# Patient Record
Sex: Female | Born: 1980 | Race: White | Hispanic: No | Marital: Single | Smoking: Never smoker
Health system: Southern US, Community
[De-identification: ages and names within clinical notes are randomized; demographics above are authoritative.]

## PROBLEM LIST (undated history)

## (undated) DIAGNOSIS — R87619 Unspecified abnormal cytological findings in specimens from cervix uteri: Secondary | ICD-10-CM

## (undated) DIAGNOSIS — J45909 Unspecified asthma, uncomplicated: Secondary | ICD-10-CM

---

## 2004-05-29 DIAGNOSIS — R87619 Unspecified abnormal cytological findings in specimens from cervix uteri: Secondary | ICD-10-CM

## 2004-05-29 HISTORY — DX: Unspecified abnormal cytological findings in specimens from cervix uteri: R87.619

## 2009-05-29 HISTORY — PX: WISDOM TOOTH EXTRACTION: SHX21

## 2011-11-17 ENCOUNTER — Other Ambulatory Visit (HOSPITAL_COMMUNITY): Payer: Self-pay | Admitting: Obstetrics and Gynecology

## 2011-11-17 DIAGNOSIS — O0930 Supervision of pregnancy with insufficient antenatal care, unspecified trimester: Secondary | ICD-10-CM

## 2011-11-17 DIAGNOSIS — O3680X0 Pregnancy with inconclusive fetal viability, not applicable or unspecified: Secondary | ICD-10-CM

## 2011-11-22 ENCOUNTER — Other Ambulatory Visit (HOSPITAL_COMMUNITY): Payer: Self-pay | Admitting: Obstetrics and Gynecology

## 2011-11-22 ENCOUNTER — Ambulatory Visit (HOSPITAL_COMMUNITY)
Admission: RE | Admit: 2011-11-22 | Discharge: 2011-11-22 | Disposition: A | Discharge status: Home / Self Care | Payer: Medicaid Other | Source: Ambulatory Visit | Attending: Obstetrics and Gynecology | Admitting: Obstetrics and Gynecology

## 2011-11-22 DIAGNOSIS — O3680X0 Pregnancy with inconclusive fetal viability, not applicable or unspecified: Secondary | ICD-10-CM

## 2011-11-22 DIAGNOSIS — O0930 Supervision of pregnancy with insufficient antenatal care, unspecified trimester: Secondary | ICD-10-CM

## 2011-11-22 DIAGNOSIS — Z3689 Encounter for other specified antenatal screening: Secondary | ICD-10-CM | POA: Insufficient documentation

## 2011-11-29 ENCOUNTER — Encounter (HOSPITAL_COMMUNITY): Payer: Self-pay | Admitting: *Deleted

## 2011-12-01 ENCOUNTER — Other Ambulatory Visit: Payer: Self-pay | Admitting: Obstetrics and Gynecology

## 2011-12-02 ENCOUNTER — Encounter (HOSPITAL_COMMUNITY): Payer: Self-pay

## 2011-12-07 ENCOUNTER — Other Ambulatory Visit: Payer: Self-pay | Admitting: Obstetrics and Gynecology

## 2011-12-07 NOTE — H&P (Signed)
NAME:  Katie Bradley, DIEGO NO.:  MEDICAL RECORD NO.:  [id number]  LOCATION:                                 FACILITY:  PHYSICIAN:  Malachi Pro. Ambrose Mantle, M.D. DATE OF BIRTH:  23-May-1981  DATE OF ADMISSION:  12/08/2011 DATE OF DISCHARGE:                             HISTORY & PHYSICAL   PRESENT ILLNESS:  This is a 31 year old black female, para 1-0-1-1, gravida 3 with last menstrual period July 29, 2011 who is admitted for D and C because of anembryonic pregnancy.  This patient's last period was July 29, 2011.  She began to think she was pregnant in late April 2013. She did not seek care because she was waiting for her Medicaid to become effective.  On November 22, 2011, she was seen in the maternity admission unit because of bleeding during pregnancy and the findings were anembryonic pregnancy.  The patient came to our office on July 3, was given her options of medical treatment, observation or D and C.  She chose to proceed with D and C.  She is admitted now for that procedure.  PAST MEDICAL HISTORY:  Reveals no known allergies.  No operations except wisdom teeth extraction.  She does have a history of asthma.  SOCIAL HISTORY:  She denies alcohol, tobacco, and substance abuse.  FAMILY HISTORY:  Her mother has diabetes and high blood pressure.  PHYSICAL EXAMINATION:  VITAL SIGNS:  Blood pressure is 118/70, pulse is 80. HEAD, EYES, NOSE, AND THROAT:  Normal. BREASTS:  Soft without masses. LUNGS:  Clear to auscultation. HEART:  Normal size and sounds.  No murmurs. ABDOMEN:  Soft, nontender.  No masses are palpable. PELVIC:  The uterus is anterior, thought to be about 7-8 week size, is slightly irregular suggesting fibroids.  The adnexa are free.  An ultrasound done in the office showed empty sac.  The patient has been given her options again.  She understands and wants to proceed with a suction D and C.  She has been informed of all the risks of surgery including,  but not limited to, blood loss, perforation of the uterus, injury to surrounding structures, infection, potential need for even hysterectomy.  The patient understands and agrees to proceed.     Malachi Pro. Ambrose Mantle, M.D.    TFH/MEDQ  D:  12/07/2011  T:  12/07/2011  Job:  [id number]

## 2011-12-08 ENCOUNTER — Ambulatory Visit (HOSPITAL_COMMUNITY)
Admission: RE | Admit: 2011-12-08 | Discharge: 2011-12-08 | Disposition: A | Discharge status: Home / Self Care | Payer: Medicaid Other | Source: Ambulatory Visit | Attending: Obstetrics and Gynecology | Admitting: Obstetrics and Gynecology

## 2011-12-08 ENCOUNTER — Encounter (HOSPITAL_COMMUNITY): Payer: Self-pay | Admitting: Anesthesiology

## 2011-12-08 ENCOUNTER — Ambulatory Visit (HOSPITAL_COMMUNITY): Payer: Medicaid Other | Admitting: Anesthesiology

## 2011-12-08 ENCOUNTER — Encounter (HOSPITAL_COMMUNITY)
Admission: RE | Disposition: A | Discharge status: Home / Self Care | Payer: Self-pay | Source: Ambulatory Visit | Attending: Obstetrics and Gynecology

## 2011-12-08 DIAGNOSIS — O020 Blighted ovum and nonhydatidiform mole: Secondary | ICD-10-CM

## 2011-12-08 DIAGNOSIS — N90810 Female genital mutilation status, unspecified: Secondary | ICD-10-CM

## 2011-12-08 DIAGNOSIS — O021 Missed abortion: Secondary | ICD-10-CM | POA: Insufficient documentation

## 2011-12-08 HISTORY — DX: Unspecified asthma, uncomplicated: J45.909

## 2011-12-08 HISTORY — PX: DILATION AND EVACUATION: SHX1459

## 2011-12-08 LAB — CBC WITH DIFFERENTIAL/PLATELET
Basophils Absolute: 0 10*3/uL (ref 0.0–0.1)
Basophils Relative: 0 % (ref 0–1)
HCT: 35.4 % — ABNORMAL LOW (ref 36.0–46.0)
MCHC: 33.6 g/dL (ref 30.0–36.0)
Monocytes Absolute: 0.5 10*3/uL (ref 0.1–1.0)
Neutro Abs: 3 10*3/uL (ref 1.7–7.7)
RDW: 13.3 % (ref 11.5–15.5)

## 2011-12-08 LAB — URINALYSIS, ROUTINE W REFLEX MICROSCOPIC
Glucose, UA: NEGATIVE mg/dL
Specific Gravity, Urine: >1.03 — ABNORMAL HIGH (ref 1.005–1.030)

## 2011-12-08 LAB — COMPREHENSIVE METABOLIC PANEL
AST: 16 U/L (ref 0–37)
Albumin: 3.6 g/dL (ref 3.5–5.2)
Calcium: 9 mg/dL (ref 8.4–10.5)
Chloride: 105 mEq/L (ref 96–112)
Creatinine, Ser: 0.62 mg/dL (ref 0.50–1.10)

## 2011-12-08 LAB — URINE MICROSCOPIC-ADD ON

## 2011-12-08 SURGERY — DILATION AND EVACUATION, UTERUS
Anesthesia: Monitor Anesthesia Care | Site: Uterus | Wound class: Clean Contaminated

## 2011-12-08 MED ORDER — LACTATED RINGERS IV SOLN
INTRAVENOUS | Status: DC
  Administered 2011-12-08 (×3): via INTRAVENOUS

## 2011-12-08 MED ORDER — OXYTOCIN 40 UNITS IN LACTATED RINGERS INFUSION - SIMPLE MED
INTRAVENOUS | Status: DC | PRN
  Administered 2011-12-08: 40 [IU] via INTRAVENOUS

## 2011-12-08 MED ORDER — MIDAZOLAM HCL 2 MG/2ML IJ SOLN
INTRAMUSCULAR | Status: AC
  Filled 2011-12-08: qty 2

## 2011-12-08 MED ORDER — FENTANYL CITRATE 0.05 MG/ML IJ SOLN
INTRAMUSCULAR | Status: AC
  Administered 2011-12-08: 50 ug via INTRAVENOUS
  Filled 2011-12-08: qty 2

## 2011-12-08 MED ORDER — RHO D IMMUNE GLOBULIN 1500 UNIT/2ML IJ SOLN
300.0000 ug | Freq: Once | INTRAMUSCULAR | Status: AC
  Administered 2011-12-08: 300 ug via INTRAMUSCULAR

## 2011-12-08 MED ORDER — MIDAZOLAM HCL 2 MG/2ML IJ SOLN: 0.5000 mg | Freq: Once | INTRAMUSCULAR | Status: DC | PRN

## 2011-12-08 MED ORDER — KETOROLAC TROMETHAMINE 30 MG/ML IJ SOLN: 15.0000 mg | Freq: Once | INTRAMUSCULAR | Status: DC | PRN

## 2011-12-08 MED ORDER — FENTANYL CITRATE 0.05 MG/ML IJ SOLN
25.0000 ug | INTRAMUSCULAR | Status: DC | PRN
  Administered 2011-12-08: 50 ug via INTRAVENOUS

## 2011-12-08 MED ORDER — IBUPROFEN 600 MG PO TABS: 600.0000 mg | ORAL_TABLET | Freq: Four times a day (QID) | ORAL | Status: AC | PRN

## 2011-12-08 MED ORDER — ALBUTEROL SULFATE HFA 108 (90 BASE) MCG/ACT IN AERS
2.0000 | INHALATION_SPRAY | RESPIRATORY_TRACT | Status: DC | PRN
  Administered 2011-12-08: 2 via RESPIRATORY_TRACT

## 2011-12-08 MED ORDER — LIDOCAINE HCL (CARDIAC) 20 MG/ML IV SOLN
INTRAVENOUS | Status: DC | PRN
  Administered 2011-12-08: 50 mg via INTRAVENOUS

## 2011-12-08 MED ORDER — MIDAZOLAM HCL 5 MG/5ML IJ SOLN
INTRAMUSCULAR | Status: DC | PRN
  Administered 2011-12-08: 2 mg via INTRAVENOUS

## 2011-12-08 MED ORDER — ONDANSETRON HCL 4 MG/2ML IJ SOLN
INTRAMUSCULAR | Status: DC | PRN
  Administered 2011-12-08: 4 mg via INTRAVENOUS

## 2011-12-08 MED ORDER — FENTANYL CITRATE 0.05 MG/ML IJ SOLN
INTRAMUSCULAR | Status: AC
Start: 2011-12-08 — End: 2011-12-08
  Filled 2011-12-08: qty 2

## 2011-12-08 MED ORDER — DOXYCYCLINE HYCLATE 100 MG PO TABS: 100.0000 mg | ORAL_TABLET | Freq: Two times a day (BID) | ORAL | Status: AC

## 2011-12-08 MED ORDER — ONDANSETRON HCL 4 MG/2ML IJ SOLN
INTRAMUSCULAR | Status: AC
  Filled 2011-12-08: qty 2

## 2011-12-08 MED ORDER — MEPERIDINE HCL 25 MG/ML IJ SOLN: 6.2500 mg | INTRAMUSCULAR | Status: DC | PRN

## 2011-12-08 MED ORDER — DEXAMETHASONE SODIUM PHOSPHATE 10 MG/ML IJ SOLN
INTRAMUSCULAR | Status: DC | PRN
  Administered 2011-12-08: 10 mg via INTRAVENOUS

## 2011-12-08 MED ORDER — PROPOFOL 10 MG/ML IV EMUL
INTRAVENOUS | Status: DC | PRN
  Administered 2011-12-08: 200 ug/kg/min via INTRAVENOUS

## 2011-12-08 MED ORDER — PROMETHAZINE HCL 25 MG/ML IJ SOLN: 6.2500 mg | INTRAMUSCULAR | Status: DC | PRN

## 2011-12-08 MED ORDER — FENTANYL CITRATE 0.05 MG/ML IJ SOLN
INTRAMUSCULAR | Status: DC | PRN
  Administered 2011-12-08: 100 ug via INTRAVENOUS

## 2011-12-08 MED ORDER — LIDOCAINE HCL (CARDIAC) 20 MG/ML IV SOLN
INTRAVENOUS | Status: AC
  Filled 2011-12-08: qty 5

## 2011-12-08 MED ORDER — ACETAMINOPHEN 325 MG PO TABS: 325.0000 mg | ORAL_TABLET | ORAL | Status: DC | PRN

## 2011-12-08 MED ORDER — LIDOCAINE HCL 1 % IJ SOLN
INTRAMUSCULAR | Status: DC | PRN
  Administered 2011-12-08: 10 mL

## 2011-12-08 MED ORDER — DEXAMETHASONE SODIUM PHOSPHATE 10 MG/ML IJ SOLN
INTRAMUSCULAR | Status: AC
  Filled 2011-12-08: qty 1

## 2011-12-08 SURGICAL SUPPLY — 22 items
CATH ROBINSON RED A/P 16FR (CATHETERS) ×2 IMPLANT
CLOTH BEACON ORANGE TIMEOUT ST (SAFETY) ×2 IMPLANT
DECANTER SPIKE VIAL GLASS SM (MISCELLANEOUS) ×2 IMPLANT
GLOVE BIO SURGEON STRL SZ7.5 (GLOVE) ×4 IMPLANT
GLOVE BIOGEL PI IND STRL 6.5 (GLOVE) ×1 IMPLANT
GLOVE BIOGEL PI INDICATOR 6.5 (GLOVE) ×1
GLOVE SURG SS PI 7.0 STRL IVOR (GLOVE) ×2 IMPLANT
GOWN PREVENTION PLUS LG XLONG (DISPOSABLE) ×2 IMPLANT
GOWN PREVENTION PLUS XLARGE (GOWN DISPOSABLE) ×2 IMPLANT
KIT BERKELEY 1ST TRIMESTER 3/8 (MISCELLANEOUS) ×2 IMPLANT
NEEDLE SPNL 22GX3.5 QUINCKE BK (NEEDLE) ×2 IMPLANT
NS IRRIG 1000ML POUR BTL (IV SOLUTION) ×2 IMPLANT
PACK VAGINAL MINOR WOMEN LF (CUSTOM PROCEDURE TRAY) ×2 IMPLANT
PAD OB MATERNITY 4.3X12.25 (PERSONAL CARE ITEMS) ×2 IMPLANT
PAD PREP 24X48 CUFFED NSTRL (MISCELLANEOUS) ×2 IMPLANT
SET BERKELEY SUCTION TUBING (SUCTIONS) ×2 IMPLANT
SYR CONTROL 10ML LL (SYRINGE) ×2 IMPLANT
TOWEL OR 17X24 6PK STRL BLUE (TOWEL DISPOSABLE) ×4 IMPLANT
VACURETTE 10 RIGID CVD (CANNULA) IMPLANT
VACURETTE 7MM CVD STRL WRAP (CANNULA) IMPLANT
VACURETTE 8 RIGID CVD (CANNULA) IMPLANT
VACURETTE 9 RIGID CVD (CANNULA) ×2 IMPLANT

## 2011-12-08 NOTE — Op Note (Signed)
Operative note on Katie Bradley:  Date of the operation: 12/08/2011  Preoperative diagnosis: Anembryonic pregnancy  Postoperative diagnosis: Same  Operation: Suction D&C  Operator Adisynn Suleiman anesthesia MAC  The patient was brought to the operating room and placed under MAC anesthesia by the anesthesia staff. She was placed in the lithotomy position in the [place] stirrups. Exam revealed the uterus to be anterior 7 -8 weeks size. A timeout was done. The lower abdomen medial thighs perineum vagina and urethra were prepped with Betadine solution. The bladder was emptied with a [place] catheter. There were no adnexal masses. The area was draped as a sterile field. The cervix was exposed and the anterior lip was grasped with a single-tooth tenaculum. The uterus sounded 11 cm in the midplane to slightly anterior. The cervical canal was dilated so that it would admit a #9 curved suction curet. I used the baby remains forceps to rupture the membranes and debulk the. products of conception.After I removed some placental tissue I used the suction curet to completely remove the products of conception. I used a sharp curet to confirm that the walls of the uterus felt smooth and made a final circuit with the #9 curved suction curet. Blood loss was about 100-150 cc and there was no bleeding at the end of the procedure. I held pressure on the tenaculum sites to stop the bleeding there and I inspected the products of conception and felt that there were 30-40 g of tissue. The procedure was terminated, sponge and needle counts were correct, and the patient was returned to recovery in satisfactory condition

## 2011-12-08 NOTE — Anesthesia Postprocedure Evaluation (Signed)
  Anesthesia Post-op Note  Patient: Katie Bradley  Procedure(s) Performed: Procedure(s) (LRB): DILATATION AND EVACUATION (N/A)  Patient is awake and responsive. Pain and nausea are reasonably well controlled. Vital signs are stable and clinically acceptable. Oxygen saturation is clinically acceptable. There are no apparent anesthetic complications at this time. Patient is ready for discharge.

## 2011-12-08 NOTE — Anesthesia Preprocedure Evaluation (Signed)
Anesthesia Evaluation  Patient identified by MRN, date of birth, ID band Patient awake    Reviewed: Allergy & Precautions, H&P , Patient's Chart, lab work & pertinent test results, reviewed documented beta blocker date and time   History of Anesthesia Complications Negative for: history of anesthetic complications  Airway Mallampati: II TM Distance: >3 FB Neck ROM: full    Dental No notable dental hx.    Pulmonary neg pulmonary ROS, asthma ,  breath sounds clear to auscultation  Pulmonary exam normal       Cardiovascular Exercise Tolerance: Good negative cardio ROS  Rhythm:regular Rate:Normal     Neuro/Psych negative neurological ROS  negative psych ROS   GI/Hepatic negative GI ROS, Neg liver ROS,   Endo/Other  negative endocrine ROS  Renal/GU negative Renal ROS     Musculoskeletal   Abdominal   Peds  Hematology negative hematology ROS (+)   Anesthesia Other Findings Asthma   albuterol rescue inhaler-not in past month-instructed to bring dos     Reproductive/Obstetrics negative OB ROS                           Anesthesia Physical Anesthesia Plan  ASA: II  Anesthesia Plan: MAC   Post-op Pain Management:    Induction:   Airway Management Planned:   Additional Equipment:   Intra-op Plan:   Post-operative Plan:   Informed Consent: I have reviewed the patients History and Physical, chart, labs and discussed the procedure including the risks, benefits and alternatives for the proposed anesthesia with the patient or authorized representative who has indicated his/her understanding and acceptance.   Dental Advisory Given  Plan Discussed with: CRNA, Surgeon and Anesthesiologist  Anesthesia Plan Comments:         Anesthesia Quick Evaluation

## 2011-12-08 NOTE — Transfer of Care (Signed)
Immediate Anesthesia Transfer of Care Note  Patient: Katie Bradley  Procedure(s) Performed: Procedure(s) (LRB): DILATATION AND EVACUATION (N/A)  Patient Location: PACU  Anesthesia Type: MAC  Level of Consciousness: sedated  Airway & Oxygen Therapy: Patient Spontanous Breathing and Patient connected to nasal cannula oxygen  Post-op Assessment: Report given to PACU RN and Post -op Vital signs reviewed and stable  Post vital signs: stable  Complications: No apparent anesthesia complications

## 2011-12-08 NOTE — Progress Notes (Signed)
Patient ID: Katie Bradley, female   DOB: 26-Dec-1980, 31 y.o.   MRN: [id number] I examined this lady on 12-07-11 and she reports no change in her health since that time.

## 2011-12-09 LAB — RH IG WORKUP (INCLUDES ABO/RH)
Antibody Screen: NEGATIVE
Unit division: 0

## 2011-12-11 ENCOUNTER — Encounter (HOSPITAL_COMMUNITY): Payer: Self-pay | Admitting: Obstetrics and Gynecology

## 2012-12-06 LAB — OB RESULTS CONSOLE HEPATITIS B SURFACE ANTIGEN: Hepatitis B Surface Ag: NEGATIVE

## 2012-12-06 LAB — OB RESULTS CONSOLE HIV ANTIBODY (ROUTINE TESTING): HIV: NONREACTIVE

## 2012-12-06 LAB — OB RESULTS CONSOLE RPR: RPR: NONREACTIVE

## 2012-12-06 LAB — OB RESULTS CONSOLE RUBELLA ANTIBODY, IGM: Rubella: IMMUNE

## 2013-05-26 LAB — OB RESULTS CONSOLE GBS: GBS: POSITIVE

## 2013-06-10 ENCOUNTER — Encounter (HOSPITAL_COMMUNITY): Payer: Self-pay | Admitting: *Deleted

## 2013-06-10 ENCOUNTER — Inpatient Hospital Stay (HOSPITAL_COMMUNITY)
Admission: AD | Admit: 2013-06-10 | Discharge: 2013-06-10 | Disposition: A | Discharge status: Home / Self Care | Payer: Medicaid Other | Source: Ambulatory Visit | Attending: Obstetrics and Gynecology | Admitting: Obstetrics and Gynecology

## 2013-06-10 DIAGNOSIS — O479 False labor, unspecified: Secondary | ICD-10-CM | POA: Insufficient documentation

## 2013-06-10 HISTORY — DX: Unspecified abnormal cytological findings in specimens from cervix uteri: R87.619

## 2013-06-10 NOTE — MAU Note (Signed)
PT SAYS SHE STARTED  HURTING  TODAY AT  730PM-   VE IN OFFICE - ON Friday-  1 CM.   DENIES HSV AND MRSA

## 2013-06-10 NOTE — Discharge Instructions (Signed)
Braxton Hicks Contractions Pregnancy is commonly associated with contractions of the uterus throughout the pregnancy. Towards the end of pregnancy (32 to 34 weeks), these contractions (Braxton Hicks) can develop more often and may become more forceful. This is not true labor because these contractions do not result in opening (dilatation) and thinning of the cervix. They are sometimes difficult to tell apart from true labor because these contractions can be forceful and people have different pain tolerances. You should not feel embarrassed if you go to the hospital with false labor. Sometimes, the only way to tell if you are in true labor is for your caregiver to follow the changes in the cervix. How to tell the difference between true and false labor:  False labor.  The contractions of false labor are usually shorter, irregular and not as hard as those of true labor.  They are often felt in the front of the lower abdomen and in the groin.  They may leave with walking around or changing positions while lying down.  They get weaker and are shorter lasting as time goes on.  These contractions are usually irregular.  They do not usually become progressively stronger, regular and closer together as with true labor.  True labor.  Contractions in true labor last 30 to 70 seconds, become very regular, usually become more intense, and increase in frequency.  They do not go away with walking.  The discomfort is usually felt in the top of the uterus and spreads to the lower abdomen and low back.  True labor can be determined by your caregiver with an exam. This will show that the cervix is dilating and getting thinner. If there are no prenatal problems or other health problems associated with the pregnancy, it is completely safe to be sent home with false labor and await the onset of true labor. HOME CARE INSTRUCTIONS   Keep up with your usual exercises and instructions.  Take medications as  directed.  Keep your regular prenatal appointment.  Eat and drink lightly if you think you are going into labor.  If BH contractions are making you uncomfortable:  Change your activity position from lying down or resting to walking/walking to resting.  Sit and rest in a tub of warm water.  Drink 2 to 3 glasses of water. Dehydration may cause B-H contractions.  Do slow and deep breathing several times an hour. SEEK IMMEDIATE MEDICAL CARE IF:   Your contractions continue to become stronger, more regular, and closer together.  You have a gushing, burst or leaking of fluid from the vagina.  An oral temperature above 102 F (38.9 C) develops.  You have passage of blood-tinged mucus.  You develop vaginal bleeding.  You develop continuous belly (abdominal) pain.  You have low back pain that you never had before.  You feel the baby's head pushing down causing pelvic pressure.  The baby is not moving as much as it used to. Document Released: 05/15/2005 Document Revised: 08/07/2011 Document Reviewed: 02/24/2013 Exit[employer] Patient Information 2014 [employer].  Fetal Movement Counts Patient Name: __________________________________________________ Patient Due Date: ____________________ Performing a fetal movement count is highly recommended in high-risk pregnancies, but it is good for every pregnant woman to do. Your caregiver may ask you to start counting fetal movements at 28 weeks of the pregnancy. Fetal movements often increase:  After eating a full meal.  After physical activity.  After eating or drinking something sweet or cold.  At rest. Pay attention to when you feel   the baby is most active. This will help you notice a pattern of your baby's sleep and wake cycles and what factors contribute to an increase in fetal movement. It is important to perform a fetal movement count at the same time each day when your baby is normally most active.  HOW TO COUNT FETAL  MOVEMENTS 1. Find a quiet and comfortable area to sit or lie down on your left side. Lying on your left side provides the best blood and oxygen circulation to your baby. 2. Write down the day and time on a sheet of paper or in a journal. 3. Start counting kicks, flutters, swishes, rolls, or jabs in a 2 hour period. You should feel at least 10 movements within 2 hours. 4. If you do not feel 10 movements in 2 hours, wait 2 3 hours and count again. Look for a change in the pattern or not enough counts in 2 hours. SEEK MEDICAL CARE IF:  You feel less than 10 counts in 2 hours, tried twice.  There is no movement in over an hour.  The pattern is changing or taking longer each day to reach 10 counts in 2 hours.  You feel the baby is not moving as he or she usually does. Date: ____________ Movements: ____________ Start time: ____________ Finish time: ____________  Date: ____________ Movements: ____________ Start time: ____________ Finish time: ____________ Date: ____________ Movements: ____________ Start time: ____________ Finish time: ____________ Date: ____________ Movements: ____________ Start time: ____________ Finish time: ____________ Date: ____________ Movements: ____________ Start time: ____________ Finish time: ____________ Date: ____________ Movements: ____________ Start time: ____________ Finish time: ____________ Date: ____________ Movements: ____________ Start time: ____________ Finish time: ____________ Date: ____________ Movements: ____________ Start time: ____________ Finish time: ____________  Date: ____________ Movements: ____________ Start time: ____________ Finish time: ____________ Date: ____________ Movements: ____________ Start time: ____________ Finish time: ____________ Date: ____________ Movements: ____________ Start time: ____________ Finish time: ____________ Date: ____________ Movements: ____________ Start time: ____________ Finish time: ____________ Date: ____________  Movements: ____________ Start time: ____________ Finish time: ____________ Date: ____________ Movements: ____________ Start time: ____________ Finish time: ____________ Date: ____________ Movements: ____________ Start time: ____________ Finish time: ____________  Date: ____________ Movements: ____________ Start time: ____________ Finish time: ____________ Date: ____________ Movements: ____________ Start time: ____________ Finish time: ____________ Date: ____________ Movements: ____________ Start time: ____________ Finish time: ____________ Date: ____________ Movements: ____________ Start time: ____________ Finish time: ____________ Date: ____________ Movements: ____________ Start time: ____________ Finish time: ____________ Date: ____________ Movements: ____________ Start time: ____________ Finish time: ____________ Date: ____________ Movements: ____________ Start time: ____________ Finish time: ____________  Date: ____________ Movements: ____________ Start time: ____________ Finish time: ____________ Date: ____________ Movements: ____________ Start time: ____________ Finish time: ____________ Date: ____________ Movements: ____________ Start time: ____________ Finish time: ____________ Date: ____________ Movements: ____________ Start time: ____________ Finish time: ____________ Date: ____________ Movements: ____________ Start time: ____________ Finish time: ____________ Date: ____________ Movements: ____________ Start time: ____________ Finish time: ____________ Date: ____________ Movements: ____________ Start time: ____________ Finish time: ____________  Date: ____________ Movements: ____________ Start time: ____________ Finish time: ____________ Date: ____________ Movements: ____________ Start time: ____________ Finish time: ____________ Date: ____________ Movements: ____________ Start time: ____________ Finish time: ____________ Date: ____________ Movements: ____________ Start time:  ____________ Finish time: ____________ Date: ____________ Movements: ____________ Start time: ____________ Finish time: ____________ Date: ____________ Movements: ____________ Start time: ____________ Finish time: ____________ Date: ____________ Movements: ____________ Start time: ____________ Finish time: ____________  Date: ____________ Movements: ____________ Start time: ____________ Finish time: ____________ Date: ____________ Movements: ____________ Start   time: ____________ Finish time: ____________ Date: ____________ Movements: ____________ Start time: ____________ Finish time: ____________ Date: ____________ Movements: ____________ Start time: ____________ Finish time: ____________ Date: ____________ Movements: ____________ Start time: ____________ Finish time: ____________ Date: ____________ Movements: ____________ Start time: ____________ Finish time: ____________ Date: ____________ Movements: ____________ Start time: ____________ Finish time: ____________  Date: ____________ Movements: ____________ Start time: ____________ Finish time: ____________ Date: ____________ Movements: ____________ Start time: ____________ Finish time: ____________ Date: ____________ Movements: ____________ Start time: ____________ Finish time: ____________ Date: ____________ Movements: ____________ Start time: ____________ Finish time: ____________ Date: ____________ Movements: ____________ Start time: ____________ Finish time: ____________ Date: ____________ Movements: ____________ Start time: ____________ Finish time: ____________ Date: ____________ Movements: ____________ Start time: ____________ Finish time: ____________  Date: ____________ Movements: ____________ Start time: ____________ Finish time: ____________ Date: ____________ Movements: ____________ Start time: ____________ Finish time: ____________ Date: ____________ Movements: ____________ Start time: ____________ Finish time: ____________ Date:  ____________ Movements: ____________ Start time: ____________ Finish time: ____________ Date: ____________ Movements: ____________ Start time: ____________ Finish time: ____________ Date: ____________ Movements: ____________ Start time: ____________ Finish time: ____________ Document Released: 06/14/2006 Document Revised: 05/01/2012 Document Reviewed: 03/11/2012 [employer] Patient Information 2014 [employer].  

## 2013-06-11 NOTE — Progress Notes (Signed)
FHT from 1-13 reviewed.  Reactive NST, irreg ctx.

## 2013-06-15 ENCOUNTER — Encounter (HOSPITAL_COMMUNITY): Payer: Medicaid Other | Admitting: Anesthesiology

## 2013-06-15 ENCOUNTER — Inpatient Hospital Stay (HOSPITAL_COMMUNITY)
Admission: AD | Admit: 2013-06-15 | Discharge: 2013-06-17 | DRG: 775 | Disposition: A | Discharge status: Home / Self Care | Payer: Medicaid Other | Source: Ambulatory Visit | Attending: Obstetrics and Gynecology | Admitting: Obstetrics and Gynecology

## 2013-06-15 ENCOUNTER — Encounter (HOSPITAL_COMMUNITY): Payer: Self-pay

## 2013-06-15 ENCOUNTER — Inpatient Hospital Stay (HOSPITAL_COMMUNITY): Payer: Medicaid Other | Admitting: Anesthesiology

## 2013-06-15 DIAGNOSIS — O479 False labor, unspecified: Secondary | ICD-10-CM

## 2013-06-15 DIAGNOSIS — Z22330 Carrier of Group B streptococcus: Secondary | ICD-10-CM

## 2013-06-15 DIAGNOSIS — O4290 Premature rupture of membranes, unspecified as to length of time between rupture and onset of labor, unspecified weeks of gestation: Principal | ICD-10-CM | POA: Diagnosis present

## 2013-06-15 DIAGNOSIS — O9989 Other specified diseases and conditions complicating pregnancy, childbirth and the puerperium: Secondary | ICD-10-CM

## 2013-06-15 DIAGNOSIS — O99892 Other specified diseases and conditions complicating childbirth: Secondary | ICD-10-CM | POA: Diagnosis present

## 2013-06-15 LAB — CBC
HEMATOCRIT: 30.7 % — AB (ref 36.0–46.0)
HEMOGLOBIN: 10.2 g/dL — AB (ref 12.0–15.0)
MCH: 27.3 pg (ref 26.0–34.0)
MCHC: 33.2 g/dL (ref 30.0–36.0)
MCV: 82.3 fL (ref 78.0–100.0)
Platelets: 289 10*3/uL (ref 150–400)
RBC: 3.73 MIL/uL — AB (ref 3.87–5.11)
RDW: 14.6 % (ref 11.5–15.5)
WBC: 7.9 10*3/uL (ref 4.0–10.5)

## 2013-06-15 LAB — RPR: RPR: NONREACTIVE

## 2013-06-15 MED ORDER — SODIUM BICARBONATE 8.4 % IV SOLN
INTRAVENOUS | Status: DC | PRN
  Administered 2013-06-15: 5 mL via EPIDURAL

## 2013-06-15 MED ORDER — OXYCODONE-ACETAMINOPHEN 5-325 MG PO TABS: 1.0000 | ORAL_TABLET | ORAL | Status: DC | PRN

## 2013-06-15 MED ORDER — PHENYLEPHRINE 40 MCG/ML (10ML) SYRINGE FOR IV PUSH (FOR BLOOD PRESSURE SUPPORT)
80.0000 ug | PREFILLED_SYRINGE | INTRAVENOUS | Status: DC | PRN
  Filled 2013-06-15: qty 10

## 2013-06-15 MED ORDER — OXYTOCIN 40 UNITS IN LACTATED RINGERS INFUSION - SIMPLE MED: 62.5000 mL/h | INTRAVENOUS | Status: AC | PRN

## 2013-06-15 MED ORDER — TERBUTALINE SULFATE 1 MG/ML IJ SOLN: 0.2500 mg | Freq: Once | INTRAMUSCULAR | Status: DC | PRN

## 2013-06-15 MED ORDER — ONDANSETRON HCL 4 MG/2ML IJ SOLN: 4.0000 mg | INTRAMUSCULAR | Status: DC | PRN

## 2013-06-15 MED ORDER — OXYTOCIN BOLUS FROM INFUSION: 500.0000 mL | INTRAVENOUS | Status: DC

## 2013-06-15 MED ORDER — ONDANSETRON HCL 4 MG PO TABS: 4.0000 mg | ORAL_TABLET | ORAL | Status: DC | PRN

## 2013-06-15 MED ORDER — LIDOCAINE HCL (PF) 1 % IJ SOLN
30.0000 mL | INTRAMUSCULAR | Status: DC | PRN
  Filled 2013-06-15: qty 30

## 2013-06-15 MED ORDER — ALBUTEROL SULFATE (2.5 MG/3ML) 0.083% IN NEBU
2.5000 mg | INHALATION_SOLUTION | Freq: Four times a day (QID) | RESPIRATORY_TRACT | Status: DC | PRN
  Filled 2013-06-15: qty 3

## 2013-06-15 MED ORDER — OXYCODONE-ACETAMINOPHEN 5-325 MG PO TABS
1.0000 | ORAL_TABLET | ORAL | Status: DC | PRN
  Administered 2013-06-16 – 2013-06-17 (×3): 1 via ORAL
  Filled 2013-06-15 (×3): qty 1

## 2013-06-15 MED ORDER — LANOLIN HYDROUS EX OINT: TOPICAL_OINTMENT | CUTANEOUS | Status: DC | PRN

## 2013-06-15 MED ORDER — ALBUTEROL SULFATE HFA 108 (90 BASE) MCG/ACT IN AERS: 2.0000 | INHALATION_SPRAY | Freq: Four times a day (QID) | RESPIRATORY_TRACT | Status: DC | PRN

## 2013-06-15 MED ORDER — IBUPROFEN 600 MG PO TABS
600.0000 mg | ORAL_TABLET | Freq: Four times a day (QID) | ORAL | Status: DC
  Administered 2013-06-15 – 2013-06-17 (×6): 600 mg via ORAL
  Filled 2013-06-15 (×6): qty 1

## 2013-06-15 MED ORDER — ONDANSETRON HCL 4 MG/2ML IJ SOLN: 4.0000 mg | Freq: Four times a day (QID) | INTRAMUSCULAR | Status: DC | PRN

## 2013-06-15 MED ORDER — BUTORPHANOL TARTRATE 1 MG/ML IJ SOLN
INTRAMUSCULAR | Status: AC
  Administered 2013-06-15: 1 mg via INTRAVENOUS
  Filled 2013-06-15: qty 1

## 2013-06-15 MED ORDER — PRENATAL MULTIVITAMIN CH
1.0000 | ORAL_TABLET | Freq: Every day | ORAL | Status: DC
  Filled 2013-06-15: qty 1

## 2013-06-15 MED ORDER — PHENYLEPHRINE 40 MCG/ML (10ML) SYRINGE FOR IV PUSH (FOR BLOOD PRESSURE SUPPORT): 80.0000 ug | PREFILLED_SYRINGE | INTRAVENOUS | Status: DC | PRN

## 2013-06-15 MED ORDER — ZOLPIDEM TARTRATE 5 MG PO TABS: 5.0000 mg | ORAL_TABLET | Freq: Every evening | ORAL | Status: DC | PRN

## 2013-06-15 MED ORDER — OXYTOCIN 40 UNITS IN LACTATED RINGERS INFUSION - SIMPLE MED: 1.0000 m[IU]/min | INTRAVENOUS | Status: DC

## 2013-06-15 MED ORDER — LACTATED RINGERS IV SOLN: 500.0000 mL | Freq: Once | INTRAVENOUS | Status: DC

## 2013-06-15 MED ORDER — DIBUCAINE 1 % RE OINT
1.0000 "application " | TOPICAL_OINTMENT | RECTAL | Status: DC | PRN
  Filled 2013-06-15: qty 28

## 2013-06-15 MED ORDER — EPHEDRINE 5 MG/ML INJ: 10.0000 mg | INTRAVENOUS | Status: DC | PRN

## 2013-06-15 MED ORDER — CITRIC ACID-SODIUM CITRATE 334-500 MG/5ML PO SOLN: 30.0000 mL | ORAL | Status: DC | PRN

## 2013-06-15 MED ORDER — SENNOSIDES-DOCUSATE SODIUM 8.6-50 MG PO TABS
2.0000 | ORAL_TABLET | ORAL | Status: DC
  Administered 2013-06-15 – 2013-06-16 (×2): 2 via ORAL
  Filled 2013-06-15 (×2): qty 2

## 2013-06-15 MED ORDER — LACTATED RINGERS IV SOLN
INTRAVENOUS | Status: DC
  Administered 2013-06-15: 16:00:00 via INTRAVENOUS

## 2013-06-15 MED ORDER — ACETAMINOPHEN 325 MG PO TABS: 650.0000 mg | ORAL_TABLET | ORAL | Status: DC | PRN

## 2013-06-15 MED ORDER — IBUPROFEN 600 MG PO TABS: 600.0000 mg | ORAL_TABLET | Freq: Four times a day (QID) | ORAL | Status: DC | PRN

## 2013-06-15 MED ORDER — PRENATAL MULTIVITAMIN CH
1.0000 | ORAL_TABLET | Freq: Every day | ORAL | Status: DC
  Administered 2013-06-16: 1 via ORAL

## 2013-06-15 MED ORDER — DIPHENHYDRAMINE HCL 50 MG/ML IJ SOLN: 12.5000 mg | INTRAMUSCULAR | Status: DC | PRN

## 2013-06-15 MED ORDER — WITCH HAZEL-GLYCERIN EX PADS: 1.0000 "application " | MEDICATED_PAD | CUTANEOUS | Status: DC | PRN

## 2013-06-15 MED ORDER — EPHEDRINE 5 MG/ML INJ
10.0000 mg | INTRAVENOUS | Status: DC | PRN
  Filled 2013-06-15: qty 4

## 2013-06-15 MED ORDER — DEXTROSE 5 % IV SOLN
5.0000 10*6.[IU] | Freq: Once | INTRAVENOUS | Status: AC
  Administered 2013-06-15: 5 10*6.[IU] via INTRAVENOUS
  Filled 2013-06-15: qty 5

## 2013-06-15 MED ORDER — BUTORPHANOL TARTRATE 1 MG/ML IJ SOLN
1.0000 mg | Freq: Once | INTRAMUSCULAR | Status: AC
  Administered 2013-06-15: 1 mg via INTRAVENOUS

## 2013-06-15 MED ORDER — DIPHENHYDRAMINE HCL 25 MG PO CAPS: 25.0000 mg | ORAL_CAPSULE | Freq: Four times a day (QID) | ORAL | Status: DC | PRN

## 2013-06-15 MED ORDER — TETANUS-DIPHTH-ACELL PERTUSSIS 5-2.5-18.5 LF-MCG/0.5 IM SUSP
0.5000 mL | Freq: Once | INTRAMUSCULAR | Status: DC
  Filled 2013-06-15: qty 0.5

## 2013-06-15 MED ORDER — OXYTOCIN 40 UNITS IN LACTATED RINGERS INFUSION - SIMPLE MED: 62.5000 mL/h | INTRAVENOUS | Status: DC

## 2013-06-15 MED ORDER — PANTOPRAZOLE SODIUM 40 MG PO TBEC
40.0000 mg | DELAYED_RELEASE_TABLET | Freq: Every day | ORAL | Status: DC
  Administered 2013-06-16: 40 mg via ORAL
  Filled 2013-06-15 (×3): qty 1

## 2013-06-15 MED ORDER — LACTATED RINGERS IV SOLN
500.0000 mL | INTRAVENOUS | Status: DC | PRN
  Administered 2013-06-15: 1000 mL via INTRAVENOUS

## 2013-06-15 MED ORDER — PENICILLIN G POTASSIUM 5000000 UNITS IJ SOLR
2.5000 10*6.[IU] | INTRAVENOUS | Status: DC
  Administered 2013-06-15 (×2): 2.5 10*6.[IU] via INTRAVENOUS
  Filled 2013-06-15 (×5): qty 2.5

## 2013-06-15 MED ORDER — LACTATED RINGERS IV SOLN: INTRAVENOUS | Status: AC

## 2013-06-15 MED ORDER — FLEET ENEMA 7-19 GM/118ML RE ENEM: 1.0000 | ENEMA | RECTAL | Status: DC | PRN

## 2013-06-15 MED ORDER — OXYTOCIN 40 UNITS IN LACTATED RINGERS INFUSION - SIMPLE MED
1.0000 m[IU]/min | INTRAVENOUS | Status: DC
  Administered 2013-06-15: 1 m[IU]/min via INTRAVENOUS
  Filled 2013-06-15: qty 1000

## 2013-06-15 MED ORDER — MEASLES, MUMPS & RUBELLA VAC ~~LOC~~ INJ
0.5000 mL | INJECTION | Freq: Once | SUBCUTANEOUS | Status: DC
  Filled 2013-06-15: qty 0.5

## 2013-06-15 MED ORDER — SIMETHICONE 80 MG PO CHEW
80.0000 mg | CHEWABLE_TABLET | ORAL | Status: DC | PRN
Start: 2013-06-15 — End: 2013-06-17

## 2013-06-15 MED ORDER — FENTANYL 2.5 MCG/ML BUPIVACAINE 1/10 % EPIDURAL INFUSION (WH - ANES)
14.0000 mL/h | INTRAMUSCULAR | Status: DC | PRN
  Administered 2013-06-15: 14 mL/h via EPIDURAL
  Filled 2013-06-15: qty 125

## 2013-06-15 MED ORDER — BENZOCAINE-MENTHOL 20-0.5 % EX AERO
1.0000 "application " | INHALATION_SPRAY | CUTANEOUS | Status: DC | PRN
  Filled 2013-06-15 (×2): qty 56

## 2013-06-15 NOTE — MAU Provider Note (Signed)
Asked to do speculum exam for report of leaking fluid at home  Perineum is moist  Speculum exam:  Small pool of milky white fluid seen  Ferning:  Positive

## 2013-06-15 NOTE — Progress Notes (Signed)
Patient ID: Katie Bradley, female   DOB: 15-Aug-1980, 33 y.o.   MRN: [id number] Pt is on 8 mu/ minute of pitocin and contractions are q 2-3 minutes. She has received an epidural and is comfortable. The cervix is 4-5 cm 90 % effaced and the vertex is at - 1 station.

## 2013-06-15 NOTE — Anesthesia Preprocedure Evaluation (Signed)
Anesthesia Evaluation  Patient identified by MRN, date of birth, ID band Patient awake    Reviewed: Allergy & Precautions, H&P , Patient's Chart, lab work & pertinent test results  Airway Mallampati: II  TM Distance: >3 FB Neck ROM: full    Dental  (+) Teeth Intact   Pulmonary asthma ,    breath sounds clear to auscultation       Cardiovascular  Rhythm:regular Rate:Normal     Neuro/Psych    GI/Hepatic   Endo/Other    Renal/GU      Musculoskeletal   Abdominal   Peds  Hematology   Anesthesia Other Findings       Reproductive/Obstetrics (+) Pregnancy                             Anesthesia Physical Anesthesia Plan  ASA: II  Anesthesia Plan: Epidural   Post-op Pain Management:    Induction:   Airway Management Planned:   Additional Equipment:   Intra-op Plan:   Post-operative Plan:   Informed Consent: I have reviewed the patients History and Physical, chart, labs and discussed the procedure including the risks, benefits and alternatives for the proposed anesthesia with the patient or authorized representative who has indicated his/her understanding and acceptance.   Dental Advisory Given  Plan Discussed with:   Anesthesia Plan Comments: (Labs checked- platelets confirmed with RN in room. Fetal heart tracing, per RN, reported to be stable enough for sitting procedure. Discussed epidural, and patient consents to the procedure:  included risk of possible headache,backache, failed block, allergic reaction, and nerve injury. This patient was asked if she had any questions or concerns before the procedure started.)        Anesthesia Quick Evaluation  

## 2013-06-15 NOTE — MAU Note (Addendum)
Contractions every 5-7 min apart, per pt they were getting more intense.  Denies bleeding.  Pt reports leaking clear fluid, had small gushes since Thursday at 0300. States she went to office and fluid was checked but not her water broke. Pt said it has been clear fluid and she has wore a pad but today was clear with pinkish.

## 2013-06-15 NOTE — Anesthesia Procedure Notes (Signed)

## 2013-06-15 NOTE — H&P (Signed)
NAMEKASIDI, SHANKER NO.:  [id number]  MEDICAL RECORD NO.:  [id number]  LOCATION:  9171                          FACILITY:  [facility]  PHYSICIAN:  Malachi Pro. Ambrose Mantle, M.D. DATE OF BIRTH:  30-Jul-1980  DATE OF ADMISSION:  06/15/2013 DATE OF DISCHARGE:                             HISTORY & PHYSICAL   HISTORY OF PRESENT ILLNESS:  A 33 year old black female, para 1-0-2-1, gravida 4, EDC June 19, 2013, admitted with premature rupture of the membranes.  Ultrasound on May 29 at 5 and 5/7th weeks gave an [facility] of June 21, 2013, and an ultrasound on June 24 at 9 and 5/7th weeks gave an [facility] of June 19, 2013.  Her last Pap smear in May of 2014 was normal.  Blood group and type A negative with negative antibody.  Pap smear normal.  Rubella immune.  RPR nonreactive.  Urine culture negative.  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  HIV negative.  Hemoglobin electrophoresis negative.  Cystic fibrosis negative.  One hour Glucola 110.  Repeat HIV and RPR negative.  Group B strep positive.  The patient began her prenatal course at 12 weeks.  She had no significant problems.  She was given RhoGAM at 28 weeks.  Noted to have a hemoglobin of 10.4.  I advised to take iron daily, later was encouraged to gain weight.  She came to the office on June 13, 2013, claiming to be leaking fluid.  The fern test was negative.  No pooling was seen in the vagina.  She returned to the hospital today and rupture of membranes was confirmed.  One hour Glucola was 110.  Group B strep positive.  PAST MEDICAL HISTORY:  States she had asthma at age 31.  PAST SURGICAL HISTORY:  A D & C in 2000 for termination of pregnancy. She has also had wisdom teeth extracted.  SOCIAL HISTORY:  Claims to have never smoked.  Does not drink alcohol. No illicit drugs.  Claims to have 4 years of college, 2 associate degrees.  Works as a Production designer, theatre/television/film at a [employer].  FAMILY HISTORY:  Mother with diabetes and  high blood pressure.  Father with high blood pressure.  OBSTETRIC HISTORY:  She has had one vaginal delivery of a 6-pound plus baby.  PHYSICAL EXAMINATION:  VITAL SIGNS:  On admission; temperature 98.5, pulse 98, respirations 18, blood pressure 116/84. HEART:  Normal size and sounds.  No murmurs. LUNGS:  Clear to auscultation.  Fundal height at her last prenatal visit on June 13, 2013, was 37 cm. Fetal heart tones are normal.  The cervix is 2 cm somewhat posterior, 30% effaced, vertex at -4 station.  Ruptured membranes was confirmed in MAU.  I am going to check her labs with [facility] to see if her hepatitis B surface antigen was positive.  It was recorded as positive, but to my knowledge, no workup was done for that.  The patient is group B strep positive and will be placed on penicillin and Pitocin will be begun to see if we can induce labor.     Malachi Pro. Ambrose Mantle, M.D.     TFH/MEDQ  D:  06/15/2013  T:  06/15/2013  Job:  [id number]

## 2013-06-15 NOTE — Progress Notes (Signed)
Patient ID: Katie Bradley, female   DOB: 1980/07/27, 33 y.o.   MRN: [id number] Delivery note. The pt was 4-5 cm at 5PM and was fully dilated at 5:25 PM She was prepared for delivery and with 1 contraction she delivered a living female infant spontaneously OA over a first degree laceration. Apgars were 9 and 9 at 1 and 5 minutes. The placenta was removed intact and the uterus was normal. The laceration was repaired with 3-0 vicryl EBL 400 cc's.

## 2013-06-16 LAB — CBC
HCT: 26.5 % — ABNORMAL LOW (ref 36.0–46.0)
Hemoglobin: 9 g/dL — ABNORMAL LOW (ref 12.0–15.0)
MCH: 27.3 pg (ref 26.0–34.0)
MCHC: 34 g/dL (ref 30.0–36.0)
MCV: 80.3 fL (ref 78.0–100.0)
Platelets: 279 10*3/uL (ref 150–400)
RBC: 3.3 MIL/uL — AB (ref 3.87–5.11)
RDW: 14.3 % (ref 11.5–15.5)
WBC: 14.6 10*3/uL — ABNORMAL HIGH (ref 4.0–10.5)

## 2013-06-16 MED ORDER — RHO D IMMUNE GLOBULIN 1500 UNIT/2ML IJ SOLN
300.0000 ug | Freq: Once | INTRAMUSCULAR | Status: AC
  Administered 2013-06-16: 300 ug via INTRAMUSCULAR
  Filled 2013-06-16: qty 2

## 2013-06-16 MED ORDER — PNEUMOCOCCAL VAC POLYVALENT 25 MCG/0.5ML IJ INJ
0.5000 mL | INJECTION | INTRAMUSCULAR | Status: AC
  Administered 2013-06-17: 0.5 mL via INTRAMUSCULAR
  Filled 2013-06-16: qty 0.5

## 2013-06-16 NOTE — Anesthesia Postprocedure Evaluation (Signed)
Anesthesia Post Note  Patient: Katie Bradley  Procedure(s) Performed: * No procedures listed *  Anesthesia type: Epidural  Patient location: Mother/Baby  Post pain: Pain level controlled  Post assessment: Post-op Vital signs reviewed  Last Vitals:  Filed Vitals:   06/16/13 0620  BP: 111/75  Pulse: 88  Temp: 36.8 C  Resp: 20    Post vital signs: Reviewed  Level of consciousness: awake  Complications: No apparent anesthesia complications

## 2013-06-16 NOTE — Progress Notes (Signed)
Patient ID: Katie Bradley, female   DOB: 02/07/1981, 33 y.o.   MRN: [id number] #1 afebrile BP normal HGB stable no complaints

## 2013-06-16 NOTE — Progress Notes (Signed)
UR chart review completed.  

## 2013-06-17 LAB — RH IG WORKUP (INCLUDES ABO/RH)
ABO/RH(D): A NEG
ANTIBODY SCREEN: NEGATIVE
Fetal Screen: NEGATIVE
Gestational Age(Wks): 39.3
Unit division: 0

## 2013-06-17 MED ORDER — OXYCODONE-ACETAMINOPHEN 5-325 MG PO TABS: 1.0000 | ORAL_TABLET | Freq: Four times a day (QID) | ORAL | Status: AC | PRN

## 2013-06-17 MED ORDER — FERROUS SULFATE 325 (65 FE) MG PO TABS: 325.0000 mg | ORAL_TABLET | Freq: Two times a day (BID) | ORAL | Status: AC

## 2013-06-17 MED ORDER — IBUPROFEN 600 MG PO TABS: 600.0000 mg | ORAL_TABLET | Freq: Four times a day (QID) | ORAL | Status: AC | PRN

## 2013-06-17 NOTE — Discharge Summary (Signed)
[name], Katie               ACCOUNT NO.:  [id number]  MEDICAL RECORD NO.:  [id number]  LOCATION:  9107                          FACILITY:  [facility]  PHYSICIAN:  Malachi Prohomas F. Ambrose MantleHenley, M.D. DATE OF BIRTH:  1981-03-23  DATE OF ADMISSION:  06/15/2013 DATE OF DISCHARGE:  06/17/2013                              DISCHARGE SUMMARY   HISTORY OF PRESENT ILLNESS:  A 33 year old black female, para 1-0-2-1, gravida 4, EDC June 19, 2013, admitted with premature rupture of the membranes.  Ultrasound on November 19, 2012, showed a fetus at 9 weeks and 5 days with [facility].  The patient was admitted with premature rupture of the membranes.  Blood group and type A negative with negative antibody.  Pap smear normal.  Rubella immune.  RPR nonreactive.  Urine culture negative.  Hepatitis B surface antigen was recorded as positive but was actually negative.  HIV negative. Hemoglobin electrophoresis negative.  Cystic fibrosis negative.  One hour Glucola 110.  Repeat HIV and RPR negative.  Group B strep was positive.  The patient received her RhoGAM at 28 weeks.  She had an uncomplicated prenatal course.  She did come to our office on June 13, 2013, leaking fluid, but the fern test was negative and no pooling was seen in the vagina.  On admission to the hospital, the cervix was 2 cm, somewhat posterior, 30% effaced, vertex at a -4 station.  Ruptured membranes was confirmed in the maternity admission unit.  The patient gradually developed significant labor pattern on Pitocin, received an epidural, and at 5 p.m., was on 8 milliunits a minute of Pitocin. Contractions every 2 to 3 minutes.  Cervix was 4- to 5- cm 90%, vertex at -1.  She became fully dilated at 5:25 p.m., prepared for delivery and with one contraction, she delivered a living female infant spontaneously OA over first-degree laceration by Dr. Ambrose MantleHenley.  Apgars were 9 and 9 at 1 and 5 minutes.  Placenta was removed intact.  Uterus  normal.  Laceration repaired with 3-0 Vicryl.  Blood loss about 400 mL.  Postpartum, the patient did well and was discharged on the second postpartum day. Initial hemoglobin 10.2, hematocrit 30.7, white count 7900, platelet count 289,000.  Followup hemoglobin was 9.0, hematocrit 26.5.  The patient was checked for RhoGAM and was considered okay to transfusion on June 16, 2013.  FINAL DIAGNOSES:  Intrauterine pregnancy at 39+ weeks, delivered vertex, premature rupture of the membranes.  OPERATION:  Spontaneous delivery, vertex repair of first-degree laceration.  FINAL CONDITION:  Improved.  INSTRUCTIONS:  Include our regular discharge instruction booklet as well as the after visit summary.  She is advised to return in 6 weeks. Prescriptions are given for Motrin 600 mg 30 tablets one every 6 hours as needed for pain and Percocet 5/325, 15 tablets one every 6 hours as needed for pain, and ferrous sulfate 325 mg 60 tablets one twice a day with 3 refills.  She is to return in 6 weeks for followup examination.    Malachi Prohomas F. Ambrose MantleHenley, M.D.    TFH/MEDQ  D:  06/17/2013  T:  06/17/2013  Job:  [id number]

## 2013-06-17 NOTE — Discharge Instructions (Signed)
booklet °

## 2013-06-17 NOTE — Progress Notes (Signed)
Patient ID: Katie Bradley, female   DOB: 27-Jun-1980, 33 y.o.   MRN: [id number] #2 afebrile BP normal for d/c

## 2013-06-19 IMAGING — US US OB COMP LESS 14 WK
2 series · 14 of 24 positions shown · non-contrast
Comparison: none

[Series 1: us ob comp less 14 wk · 2 of 3 slices shown (1 of 2)]
[im 1/3]
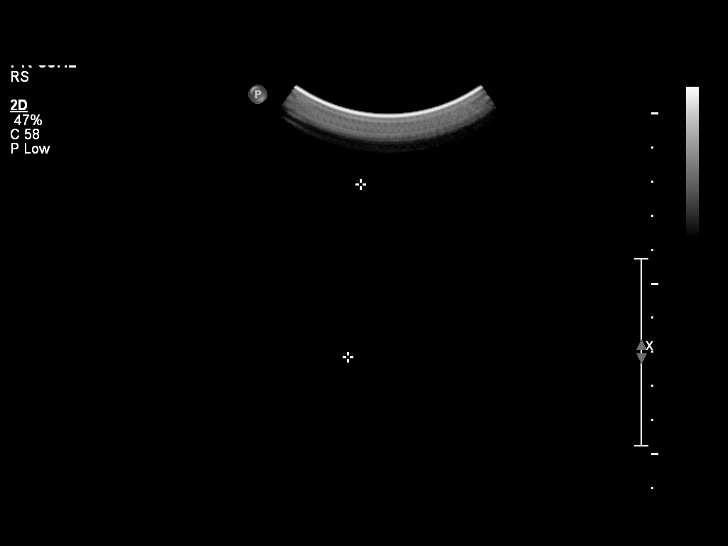
[im 3/3]
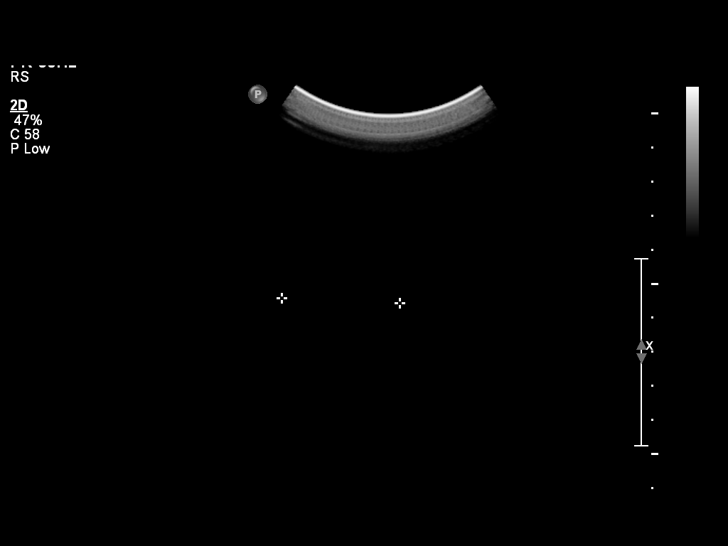

[Series 1: us ob comp less 14 wk · 12 of 21 slices shown (2 of 2)]
[im 2/21]
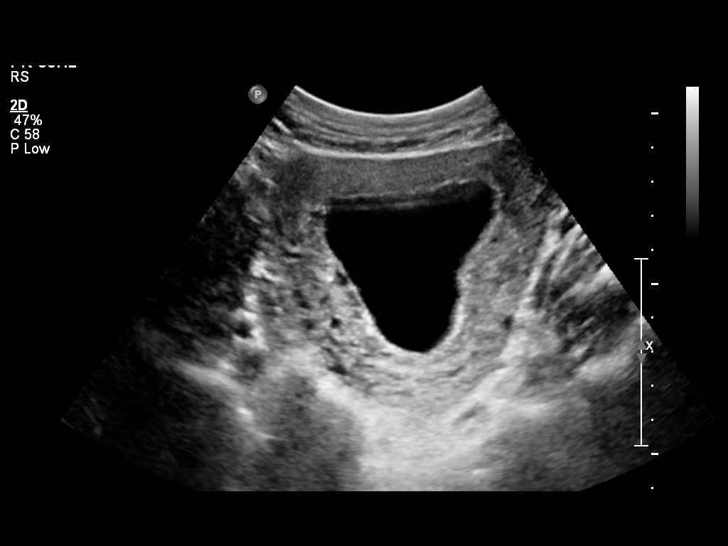
[im 4/21]
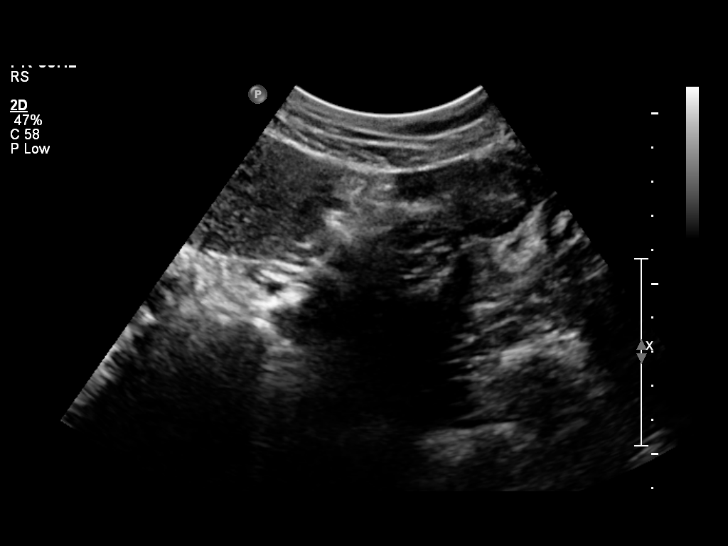
[im 5/21]
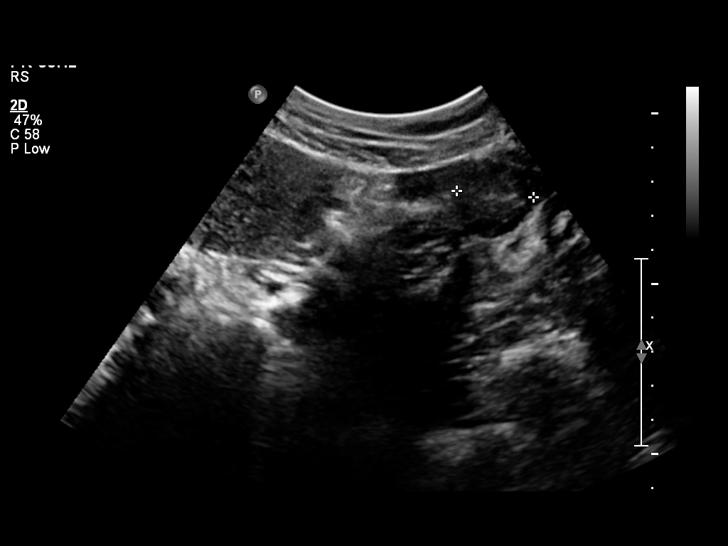
[im 7/21]
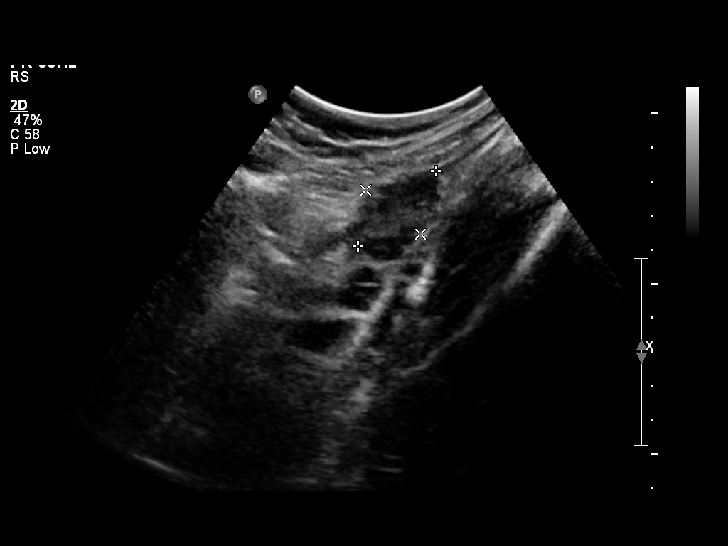
[im 9/21]
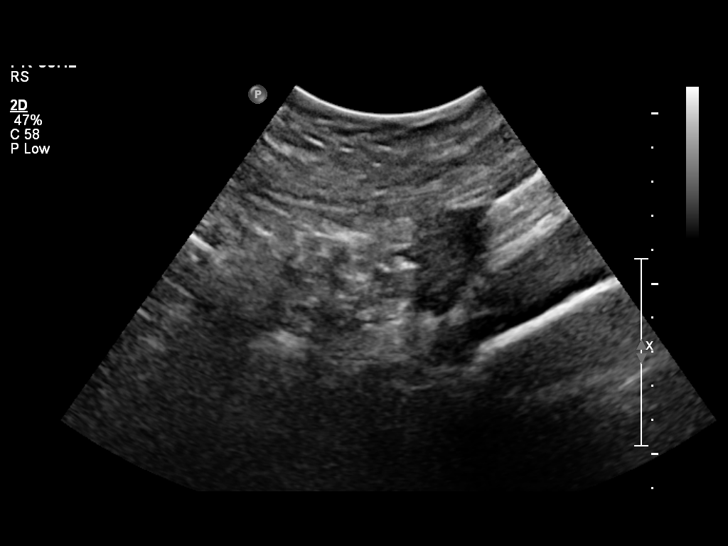
[im 10/21]
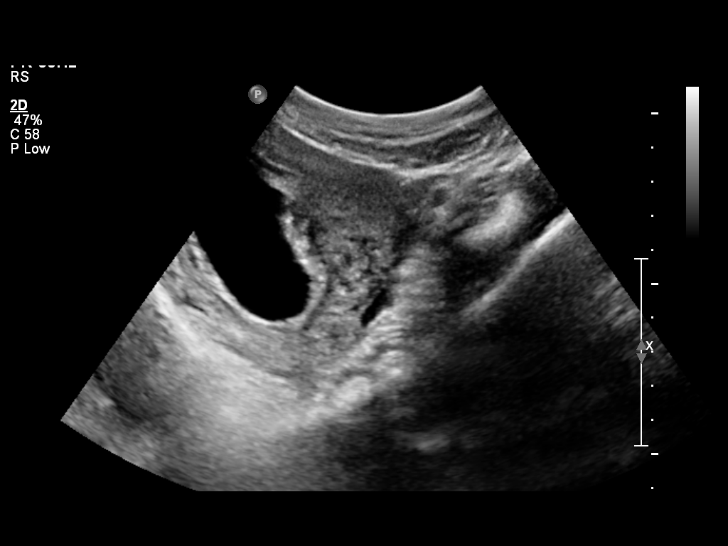
[im 12/21]
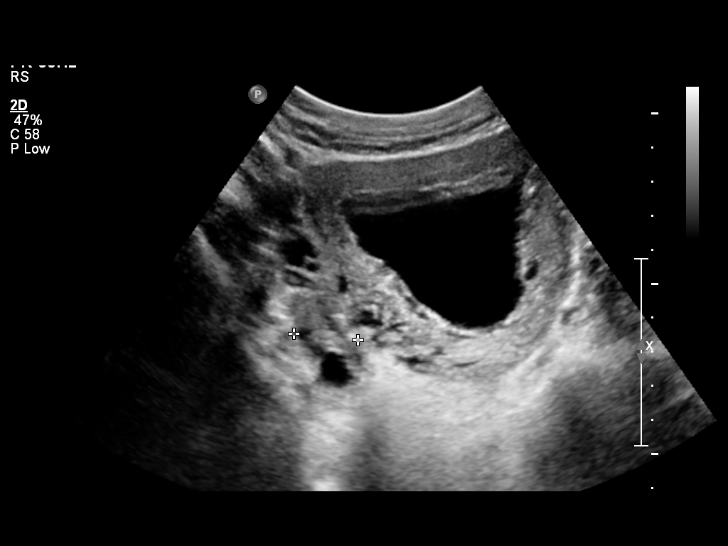
[im 14/21]
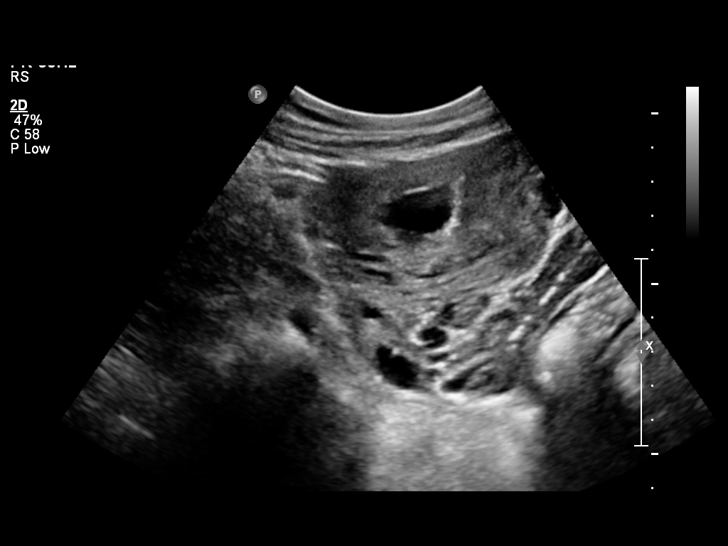
[im 16/21]
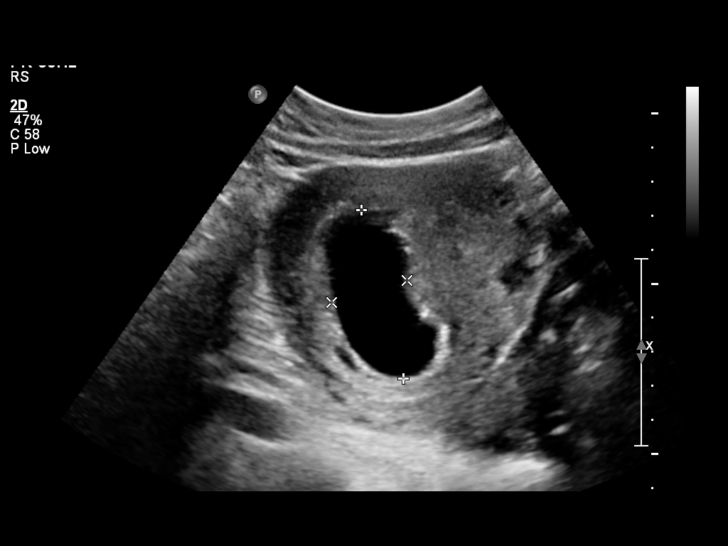
[im 17/21]
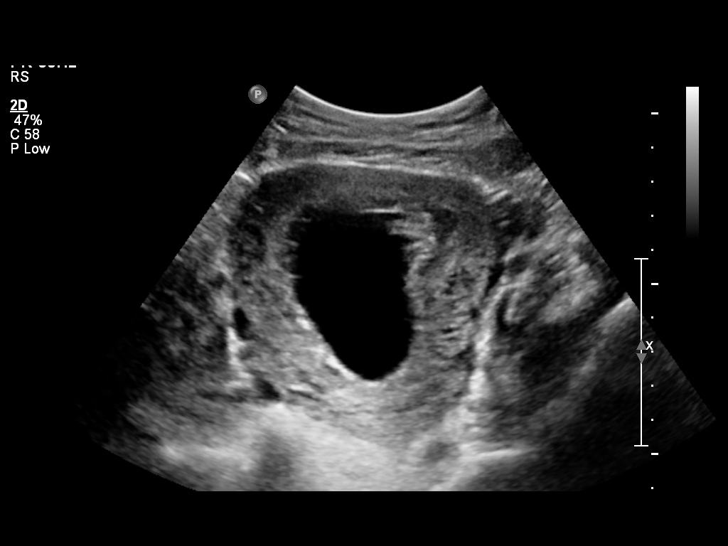
[im 19/21]
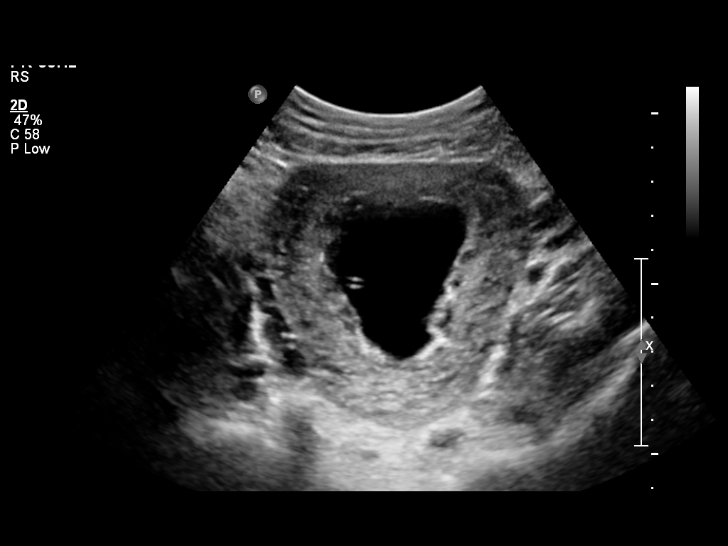
[im 21/21]
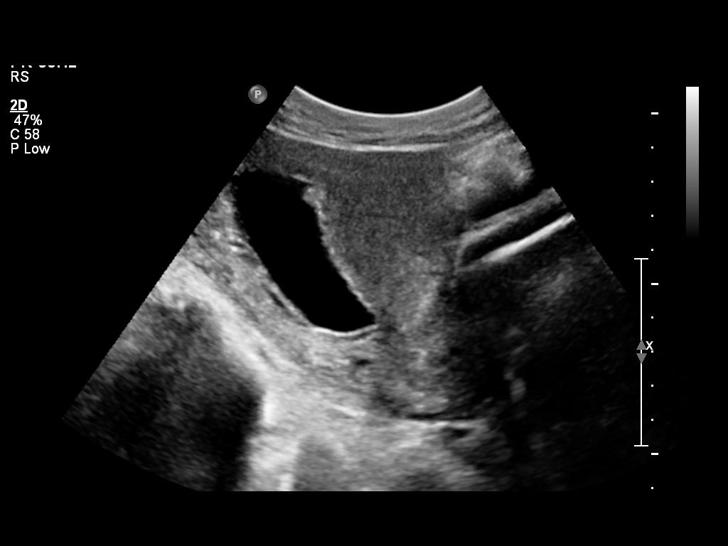

[14 of 24 positions shown; findings below may reference images not displayed]

OBSTETRICS REPORT
                      (Signed Final 11/22/2011 [DATE])

 Order#:         [id number]
Procedures

 US OB COMP LESS 14 WKS                                76801.0
Indications

 No fetal cardiac activity detected
 No or Little Prenatal Care
Fetal Evaluation

 Gest. Sac:         Irregular
 Yolk Sac:          Visualized
 Fetal Pole:        Not visualized
 Cardiac Activity:  No embryo visualized
Biometry

 GS:        36  mm    G. Age:   9w 4d                  EDD:   06/22/12
Cervix Uterus Adnexa

 Cervix:       Closed.

 Left Ovary:   Within normal limits. L.[id number].Lcm
 Right Ovary:  Within normal limits. S.QDV.QDV.6cm
 Adnexa:     No abnormality visualized.
Impression

 Findings meet criteria for anembryonic gestation (blighted
 ovum).

## 2014-03-30 ENCOUNTER — Encounter (HOSPITAL_COMMUNITY): Payer: Self-pay

## 2022-08-08 ENCOUNTER — Other Ambulatory Visit: Payer: Self-pay | Admitting: Obstetrics and Gynecology

## 2022-08-08 DIAGNOSIS — R928 Other abnormal and inconclusive findings on diagnostic imaging of breast: Secondary | ICD-10-CM

## 2022-08-26 ENCOUNTER — Ambulatory Visit
Admission: RE | Admit: 2022-08-26 | Discharge: 2022-08-26 | Disposition: A | Discharge status: Home / Self Care | Payer: Self-pay | Source: Ambulatory Visit | Attending: Obstetrics and Gynecology | Admitting: Obstetrics and Gynecology

## 2022-08-26 ENCOUNTER — Ambulatory Visit
Admission: RE | Admit: 2022-08-26 | Discharge: 2022-08-26 | Disposition: A | Discharge status: Home / Self Care | Payer: No Typology Code available for payment source | Source: Ambulatory Visit | Attending: Obstetrics and Gynecology | Admitting: Obstetrics and Gynecology

## 2022-08-26 DIAGNOSIS — R928 Other abnormal and inconclusive findings on diagnostic imaging of breast: Secondary | ICD-10-CM

## 2024-05-01 DIAGNOSIS — Z6832 Body mass index (BMI) 32.0-32.9, adult: Secondary | ICD-10-CM | POA: Diagnosis not present

## 2024-05-01 DIAGNOSIS — Z23 Encounter for immunization: Secondary | ICD-10-CM | POA: Diagnosis not present

## 2024-05-01 DIAGNOSIS — Z1339 Encounter for screening examination for other mental health and behavioral disorders: Secondary | ICD-10-CM | POA: Diagnosis not present

## 2024-05-01 DIAGNOSIS — Z1331 Encounter for screening for depression: Secondary | ICD-10-CM | POA: Diagnosis not present

## 2024-05-01 DIAGNOSIS — Z0000 Encounter for general adult medical examination without abnormal findings: Secondary | ICD-10-CM | POA: Diagnosis not present
